# Patient Record
Sex: Male | Born: 1990 | Race: Black or African American | Hispanic: No | Marital: Single | Smoking: Current every day smoker
Health system: Southern US, Community
[De-identification: ages and names within clinical notes are randomized; demographics above are authoritative.]

---
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
## 2009-02-14 ENCOUNTER — Emergency Department (HOSPITAL_COMMUNITY): Admission: EM | Admit: 2009-02-14 | Discharge: 2009-02-14 | Payer: Self-pay | Admitting: Emergency Medicine

## 2009-10-10 ENCOUNTER — Ambulatory Visit: Payer: Self-pay | Admitting: Family Medicine

## 2009-10-10 DIAGNOSIS — F1210 Cannabis abuse, uncomplicated: Secondary | ICD-10-CM | POA: Insufficient documentation

## 2009-10-10 DIAGNOSIS — F17200 Nicotine dependence, unspecified, uncomplicated: Secondary | ICD-10-CM

## 2009-10-10 DIAGNOSIS — R350 Frequency of micturition: Secondary | ICD-10-CM

## 2009-10-11 ENCOUNTER — Encounter (INDEPENDENT_AMBULATORY_CARE_PROVIDER_SITE_OTHER): Payer: Self-pay | Admitting: *Deleted

## 2009-10-11 LAB — CONVERTED CEMR LAB
AST: 17 units/L (ref 0–37)
Albumin: 4.5 g/dL (ref 3.5–5.2)
Alkaline Phosphatase: 44 units/L (ref 39–117)
Basophils Relative: 1 % (ref 0.0–3.0)
CO2: 31 meq/L (ref 19–32)
Calcium: 10 mg/dL (ref 8.4–10.5)
Chloride: 103 meq/L (ref 96–112)
Cholesterol: 149 mg/dL (ref 0–200)
Creatinine, Ser: 0.9 mg/dL (ref 0.4–1.5)
Eosinophils Relative: 6.8 % — ABNORMAL HIGH (ref 0.0–5.0)
Glucose, Bld: 82 mg/dL (ref 70–99)
HCT: 44.8 % (ref 39.0–52.0)
MCHC: 33.6 g/dL (ref 30.0–36.0)
MCV: 104.2 fL — ABNORMAL HIGH (ref 78.0–100.0)
Monocytes Relative: 7.6 % (ref 3.0–12.0)
RBC: 4.3 M/uL (ref 4.22–5.81)
TSH: 1.03 microintl units/mL (ref 0.35–5.50)
WBC: 5.1 10*3/uL (ref 4.5–10.5)

## 2009-10-12 ENCOUNTER — Telehealth (INDEPENDENT_AMBULATORY_CARE_PROVIDER_SITE_OTHER): Payer: Self-pay | Admitting: *Deleted

## 2009-10-12 LAB — CONVERTED CEMR LAB: Chlamydia, Swab/Urine, PCR: NEGATIVE

## 2010-01-30 ENCOUNTER — Emergency Department (HOSPITAL_COMMUNITY): Admission: EM | Admit: 2010-01-30 | Discharge: 2010-01-31 | Payer: Self-pay | Admitting: Emergency Medicine

## 2010-01-31 ENCOUNTER — Ambulatory Visit: Payer: Self-pay | Admitting: Family Medicine

## 2010-01-31 DIAGNOSIS — F329 Major depressive disorder, single episode, unspecified: Secondary | ICD-10-CM

## 2010-03-02 ENCOUNTER — Ambulatory Visit: Payer: Self-pay | Admitting: Family Medicine

## 2010-11-09 NOTE — Progress Notes (Signed)
Summary: lab results   Phone Note Outgoing Call   Call placed by: [facility] CMA,  October 12, 2009 8:41 AM Summary of Call: left message to call office with pt mother.........................Marland KitchenFelecia Deloach CMA  October 12, 2009 8:41 AM   Chlamydia and GC Probe Amp, Urine , hiv: normal  Follow-up for Phone Call        pt aware...................Marland KitchenFelecia Deloach CMA  October 13, 2009 8:40 AM

## 2010-11-09 NOTE — Letter (Signed)
Summary: Results Follow up Letter  [facility] [place]  [address] [place], [place] [address]   Phone: [phone]  Fax: [phone]    10/11/2009 MRN: [id number]  [facility] [address] [place], [place]  [address]  Dear Mr. Smithers,  The following are the results of your recent test(s):  Test         Result    Pap Smear:        Normal _____  Not Normal _____ Comments: ______________________________________________________ Cholesterol: LDL(Bad cholesterol):         Your goal is less than:         HDL (Good cholesterol):       Your goal is more than: Comments:  ______________________________________________________ Mammogram:        Normal _____  Not Normal _____ Comments:  ___________________________________________________________________ Hemoccult:        Normal _____  Not normal _______ Comments:    _____________________________________________________________________ Other Tests:  See attachment for results.   We routinely do not discuss normal results over the telephone.  If you desire a copy of the results, or you have any questions about this information we can discuss them at your next office visit.   Sincerely,    Army Fossa CMA  October 11, 2009 9:37 AM

## 2010-11-09 NOTE — Assessment & Plan Note (Signed)
Summary: SEVERE DEPRESSION,F/U ER VISIT ON 01-30-2010/RH........Edgar Orr   Vital Signs:  Patient profile:   20 year old male Weight:      137 pounds Pulse rate:   90 / minute Pulse rhythm:   regular BP sitting:   122 / 80  (left arm) Cuff size:   regular  Vitals Entered By: Army Fossa CMA (January 31, 2010 3:21 PM) CC: Pt here was sent to the ER last for Alcohol posioning. Discuss help- would like help, Depressive symptoms   History of Present Illness:  Depressive symptoms      This is a 20 year old man who presents with Depressive symptoms.  The symptoms began >1 year ago.  Pt here with mom as f/u from Er last night for intoxication.  Pt states it was an accident--He was not trying to hurt himself.  He was also smoking marijuana.  The patient reports depressed mood, but denies loss of interest/pleasure, significant weight loss, significant weight gain, insomnia, hypersomnia, psychomotor agitation, and psychomotor retardation.  The patient also reports fatigue or loss of energy and diminished concentration.  The patient denies feelings of worthlessness, indecisiveness, thoughts of death, thoughts of suicide, suicidal intent, and suicidal plans.  The patient reports the following psychosocial stressors: major life changes.  Patient's past history includes alcohol/substance abuse.  The patient reports the following manic symptoms: abnormally irritable mood.  The patient denies abnormally elevated mood, decreased need for sleep, increased talkativeness, distractibility, flight of ideas, increased goal-directed activity, and inflated self-esteem/ grandiosity.    Preventive Screening-Counseling & Management  Alcohol-Tobacco     Alcohol drinks/day: <1     Alcohol type: beer     >5/day in last 3 mos: yes     Alcohol Counseling: to STOP drinking     Feels need to cut down: yes     Smoking Status: current     Smoking Cessation Counseling: yes     Smoke Cessation Stage: contemplative     Packs/Day:  0.25     Year Started: 2006  Caffeine-Diet-Exercise     Caffeine use/day: 0     Diet Comments: good per pt     Does Patient Exercise: yes     Type of exercise: run,  pushups     Exercise (avg: min/session): 30-60     Times/week: 3      Drug Use:  current and marijuana.    Current Medications (verified): 1)  Symbyax 6-25 Mg Caps (Olanzapine-Fluoxetine Hcl) .Edgar Orr.. 1 By Mouth Qpm  Allergies (verified): No Known Drug Allergies  Past History:  Past medical, surgical, family and social histories (including risk factors) reviewed for relevance to current acute and chronic problems.  Past Medical History: Unremarkable Depression-- ? bipolar  Past Surgical History: Reviewed history from 10/10/2009 and no changes required. Appendectomy Tonsillectomy  Family History: Reviewed history from 10/10/2009 and no changes required. Family History High cholesterol Family History Hypertension Muncles and cousins----DM Family History Liver disease  Social History: Reviewed history from 10/10/2009 and no changes required. 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 Lungs are clear to auscultation, no crackles or wheezes. Heart:  normal rate and no murmur.   Psych:  Oriented X3, normally interactive, depressed affect, subdued, withdrawn, poor  eye contact, and tearful.     Impression & Recommendations:  Problem # 1:  DEPRESSION (ICD-311)  symbyax 3/25----  increase to 6/ 25 2-3 weeks psych referral f/u 1 month or sooner as needed  pt here > 25 min with > 50 % face to face  Discussed treatment options, including trial of antidpressant medication.  Will refer to behavioral health. Follow-up call in in 24-48 hours and recheck in 4 weeks, sooner as needed. Patient agrees to call if any worsening of symptoms or thoughts of doing harm arise. Verified that the patient has no suicidal ideation at this time.   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 1 by mouth qpm Prescriptions: SYMBYAX 6-25 MG CAPS (OLANZAPINE-FLUOXETINE HCL) 1 by mouth qpm  #30 x 1   Entered and Authorized by:   Loreen Freud DO   Signed by:   Loreen Freud DO on 01/31/2010   Method used:   Print then Give to Patient   RxID:   [id number]

## 2010-11-09 NOTE — Assessment & Plan Note (Signed)
Summary: new to be est., would like to have physical- jr   Vital Signs:  Patient profile:   20 year old male Height:      68.25 inches Weight:      142 pounds BMI:     21.51 Temp:     98.1 degrees F oral Pulse rate:   86 / minute Pulse rhythm:   regular BP sitting:   122 / 78  (left arm) Cuff size:   regular  Vitals Entered By: Army Fossa CMA (October 10, 2009 10:06 AM) CC: To establish, would like CPX.   Vision Screening:Left eye w/o correction: 20 / 10 Right Eye w/o correction: 20 / 20 Both eyes w/o correction:  20/ 13        Vision Entered By: Army Fossa CMA (October 10, 2009 11:00 AM)   History of Present Illness: Pt here for cpe and labs.  Pt complains of R sided chest pain that is improving.  Pt thinks he aggravated it lifting furniture and containers a  few months ago.  He is not doing that anymore and it is improving and has not bothered him in a couple of weeks.   Pt also c/o urinary frequency.  no dysuria.  + constipation occassionally.   No abd pain, no heartburn.  No penile D/C.  Preventive Screening-Counseling & Management  Alcohol-Tobacco     Alcohol drinks/day: <1     Alcohol type: beer     Smoking Status: current     Smoking Cessation Counseling: yes     Smoke Cessation Stage: contemplative     Packs/Day: 0.25     Year Started: 2006  Caffeine-Diet-Exercise     Caffeine use/day: 0     Diet Comments: good per pt     Does Patient Exercise: yes     Type of exercise: run,  pushups     Exercise (avg: min/session): 30-60     Times/week: 3  Hep-HIV-STD-Contraception     Dental Visit-last 6 months yes     Dental Care Counseling: not indicated; dental care within six months     TSE monthly: yes     Testicular SE Education/Counseling to perform regular STE  Safety-Violence-Falls     Seat Belt Use: yes     Seat Belt Counseling: not indicated; patient wears seat belts      Sexual History:  currently monogamous and multiple partners in the  past.        Drug Use:  current, marijuana, and yes-- last use yesterday--smokes 1-2 a day.    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 Family History High cholesterol Family History Hypertension Muncles and cousins----DM Family History Liver disease  Social History: Reviewed history and no changes required. 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 Eyes:  Denies blurring, discharge, double vision, eye irritation, eye pain, halos, itching, light sensitivity, red eye, vision loss-1 eye, and vision loss-both eyes; optho--last 2 years . ENT:  Denies decreased hearing, difficulty swallowing, ear discharge, earache, hoarseness, nasal congestion, nosebleeds, postnasal drainage, ringing in ears, sinus pressure, and sore throat. CV:   Denies bluish discoloration of lips or nails, chest pain or discomfort, difficulty breathing at night, difficulty breathing while lying down, fainting, fatigue, leg cramps with exertion, lightheadness, near fainting, palpitations, shortness of breath with exertion, swelling of feet, swelling of hands, and weight gain. Resp:  Denies chest discomfort, chest pain with inspiration, cough, coughing up blood, excessive snoring, hypersomnolence, morning headaches, pleuritic, shortness of breath, sputum productive, and wheezing. GI:  Complains of abdominal pain and constipation; denies bloody stools, change in bowel habits, dark tarry stools, diarrhea, excessive appetite, gas, hemorrhoids, indigestion, loss of appetite, nausea, vomiting, vomiting blood, and yellowish skin color. GU:  Complains of urinary frequency; denies decreased libido, discharge, dysuria, erectile dysfunction, genital sores, hematuria, incontinence, nocturia, and urinary hesitancy. MS:  Denies joint pain, joint redness, joint swelling, loss of strength, low back pain, mid back pain, muscle aches, muscle , cramps, muscle weakness, stiffness, and thoracic pain. Derm:  Denies changes in color of skin, changes in nail beds, dryness, excessive perspiration, flushing, hair loss, insect bite(s), itching, lesion(s), poor wound healing, and rash. Neuro:  Denies brief paralysis, difficulty with concentration, disturbances in coordination, falling down, headaches, inability to speak, memory loss, numbness, poor balance, seizures, sensation of room spinning, tingling, tremors, visual disturbances, and weakness. Psych:  Denies alternate hallucination ( auditory/visual), anxiety, depression, easily angered, easily tearful, irritability, mental problems, panic attacks, sense of great danger, suicidal thoughts/plans, thoughts of violence, unusual visions or sounds, and thoughts /plans of harming others. Endo:  Complains of polyuria; denies cold intolerance,  excessive hunger, excessive thirst, excessive urination, heat intolerance, and weight change. Heme:  Denies abnormal bruising, bleeding, enlarge lymph nodes, fevers, pallor, and skin discoloration. Allergy:  Denies hives or rash, itching eyes, persistent infections, seasonal allergies, and sneezing.  Physical Exam  General:  Well-developed,well-nourished,in no acute distress; alert,appropriate and cooperative throughout examination Head:  Normocephalic and atraumatic without obvious abnormalities. No apparent alopecia or balding. Eyes:  vision grossly intact, pupils equal, pupils round, pupils reactive to light, and no injection.   Ears:  External ear exam shows no significant lesions or deformities.  Otoscopic examination reveals clear canals, tympanic membranes are intact bilaterally without bulging, retraction, inflammation or discharge. Hearing is grossly normal bilaterally. Nose:  External nasal examination shows no deformity or inflammation. Nasal mucosa are pink and moist without lesions or exudates. Mouth:  Oral mucosa and oropharynx without lesions or exudates.  Teeth in good repair. Neck:  No deformities, masses, or tenderness noted. Chest Wall:  No deformities, masses, tenderness or gynecomastia noted. Lungs:  Normal respiratory effort, chest expands symmetrically. Lungs are clear to auscultation, no crackles or wheezes. Heart:  normal rate and no murmur.   Abdomen:  Bowel sounds positive,abdomen soft and non-tender without masses, organomegaly or hernias noted. Genitalia:  Testes bilaterally descended without nodularity, tenderness or masses. No scrotal masses or lesions. No penis lesions or urethral discharge. Msk:  normal ROM, no  joint tenderness, no joint swelling, no joint warmth, no redness over joints, no joint deformities, no joint instability, and no crepitation.   Pulses:  R and L carotid,radial,femoral,dorsalis pedis and posterior tibial pulses are full and equal  bilaterally Extremities:  No clubbing, cyanosis, edema, or deformity noted with normal full range of motion of all joints.   Neurologic:  No cranial nerve deficits noted. Station and gait are normal. Plantar reflexes are down-going bilaterally. DTRs are symmetrical throughout. Sensory, motor and coordinative functions appear intact. Skin:  Intact without suspicious lesions or rashes Cervical Nodes:  No lymphadenopathy noted Axillary Nodes:  No palpable lymphadenopathy Psych:  Cognition and judgment appear intact. Alert and cooperative with normal attention span and concentration. No apparent delusions, illusions, hallucinations   Impression & Recommendations:  Problem # 1:  PREVENTIVE HEALTH CARE (ICD-V70.0) ghm utd  Orders: Venipuncture (81191) TLB-Lipid Panel (80061-LIPID) TLB-BMP (Basic Metabolic Panel-BMET) (80048-METABOL) TLB-CBC Platelet - w/Differential (85025-CBCD) TLB-Hepatic/Liver Function Pnl (80076-HEPATIC) TLB-TSH (Thyroid Stimulating Hormone) (84443-TSH) T-Chlamydia  Probe, urine (47829-56213) T-GC Probe, urine (08657-84696) T-HIV Antibody  (Reflex) ([id number]) T-RPR (Syphilis) (40102-72536) T- * Misc. Laboratory test ([phone]) Tobacco use cessation intermediate 3-10 minutes (47425) Vision Screening (95638)  Reviewed preventive care protocols, scheduled due services, and updated immunizations.  Problem # 2:  SEXUAL ACTIVITY, HIGH RISK (ICD-V69.2)  Orders: Venipuncture (75643) TLB-Lipid Panel (80061-LIPID) TLB-BMP (Basic Metabolic Panel-BMET) (80048-METABOL) TLB-CBC Platelet - w/Differential (85025-CBCD) TLB-Hepatic/Liver Function Pnl (80076-HEPATIC) TLB-TSH (Thyroid Stimulating Hormone) (84443-TSH) T-Chlamydia  Probe, urine (32951-88416) T-GC Probe, urine (60630-16010) T-HIV Antibody  (Reflex) [id number]) T-RPR (Syphilis) (02542-70623) T- * Misc. Laboratory test [phone]) Tobacco use cessation intermediate 3-10 minutes (15176)  Problem # 3:  FREQUENCY,  URINARY (ICD-788.41)  Orders: Venipuncture (16073) TLB-Lipid Panel (80061-LIPID) TLB-BMP (Basic Metabolic Panel-BMET) (80048-METABOL) TLB-CBC Platelet - w/Differential (85025-CBCD) TLB-Hepatic/Liver Function Pnl (80076-HEPATIC) TLB-TSH (Thyroid Stimulating Hormone) (84443-TSH) T-Chlamydia  Probe, urine (71062-69485) T-GC Probe, urine (46270-35009) T-HIV Antibody  (Reflex) [id number]) T-RPR (Syphilis) (69678-93810) T- * Misc. Laboratory test [id number]) Tobacco use cessation intermediate 3-10 minutes (25852)  Discussed use of medication.   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

## 2010-12-26 LAB — COMPREHENSIVE METABOLIC PANEL
ALT: 23 U/L (ref 0–53)
AST: 22 U/L (ref 0–37)
Alkaline Phosphatase: 42 U/L (ref 39–117)
Calcium: 9.2 mg/dL (ref 8.4–10.5)
GFR calc Af Amer: >60 mL/min (ref >60)
GFR calc non Af Amer: >60 mL/min (ref >60)
Total Protein: 7.6 g/dL (ref 6.0–8.3)

## 2010-12-26 LAB — CBC
HCT: 41.3 % (ref 39.0–52.0)
Platelets: 216 10*3/uL (ref 150–400)
RBC: 4.07 MIL/uL — ABNORMAL LOW (ref 4.22–5.81)
RDW: 13.4 % (ref 11.5–15.5)
WBC: 5.2 10*3/uL (ref 4.0–10.5)

## 2010-12-26 LAB — URINALYSIS, ROUTINE W REFLEX MICROSCOPIC
Bilirubin Urine: NEGATIVE
Hgb urine dipstick: NEGATIVE
Ketones, ur: NEGATIVE mg/dL
Nitrite: NEGATIVE
Specific Gravity, Urine: 1.015 (ref 1.005–1.030)

## 2010-12-26 LAB — RAPID URINE DRUG SCREEN, HOSP PERFORMED
Amphetamines: NOT DETECTED
Benzodiazepines: NOT DETECTED

## 2010-12-26 LAB — ACETAMINOPHEN LEVEL: Acetaminophen (Tylenol), Serum: <10 ug/mL — ABNORMAL LOW (ref 10–30)

## 2010-12-26 LAB — DIFFERENTIAL
Basophils Absolute: 0 10*3/uL (ref 0.0–0.1)
Basophils Relative: 1 % (ref 0–1)
Neutro Abs: 3.3 10*3/uL (ref 1.7–7.7)
Neutrophils Relative %: 64 % (ref 43–77)

## 2011-02-23 NOTE — Op Note (Signed)
NAME:  Edgar Orr, Edgar Orr                        ACCOUNT NO.:  [id number]   MEDICAL RECORD NO.:  [id number]                   PATIENT TYPE:  AMB   LOCATION:  [facility]                                  FACILITY:  [facility]   PHYSICIAN:  Jefry H. Pollyann Kennedy, M.D.                DATE OF BIRTH:  Sep 22, 1991   DATE OF PROCEDURE:  05/15/2004  DATE OF DISCHARGE:                                 OPERATIVE REPORT   PREOPERATIVE DIAGNOSIS:  Chronic tonsillitis.   POSTOPERATIVE DIAGNOSIS:  Chronic tonsillitis.   PROCEDURE:  Tonsillectomy.   SURGEON:  Jefry H. Pollyann Kennedy, M.D.   ANESTHESIA:  General endotracheal anesthesia.   COMPLICATIONS:  None.   ESTIMATED BLOOD LOSS:  None.   FINDINGS:  Tonsils enlarged with deep cryptic spaces and multiple areas with  cryptic debris.   REFERRING PHYSICIAN:  Tinnie Gens A. Tawanna Cooler, M.D. 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  The risks,  benefits, alternatives, and complications of the procedure were explained to  the parents who seemed to understand and agreed to the surgery.   PROCEDURE:  The patient was taken to the operating room and placed on the  operating table in supine position.  Following induction of general  endotracheal anesthesia, the table was turned 90 degrees and the patient was  draped in standard fashion.  A Crowe-Davis mouth gag was inserted through  the oral cavity and used to retract the tongue and mandible and attached to  the Mayo stand.  Inspection of the palate revealed no evidence of a  submucous cleft or shortening of the soft palate.  Red rubber catheter was  inserted through the right side of the nose and withdrawn through the mouth  and used to retract the soft palate and uvula.  Indirect examination of the  pharynx was performed and no visible adenoid tissue was present.  The  tonsillectomy was performed using electrocautery dissection carefully  dissecting the avascular plane between the capsule and the constricture  muscles.  The tonsils were sent together for pathologic evaluation.  There  was minimal bleeding.  Spot cautery was used as needed.  The pharynx was  suctioned of secretions, irrigated with saline solution, an orogastric tube  was used to aspirate the contents of the stomach.  The patient was then  awakened, extubated, and transferred to the recovery room in stable  condition.                                              Jefry H. Pollyann Kennedy, M.D.   JHR/MEDQ  D:  05/15/2004  T:  05/15/2004  Job:  [id number]   cc:   Tinnie Gens A. Tawanna Cooler, M.D. [facility]

## 2012-02-25 ENCOUNTER — Emergency Department (HOSPITAL_COMMUNITY)
Admission: EM | Admit: 2012-02-25 | Discharge: 2012-02-25 | Disposition: A | Discharge status: Home / Self Care | Payer: Self-pay | Attending: Emergency Medicine | Admitting: Emergency Medicine

## 2012-02-25 ENCOUNTER — Encounter (HOSPITAL_COMMUNITY): Payer: Self-pay | Admitting: Emergency Medicine

## 2012-02-25 DIAGNOSIS — Y9289 Other specified places as the place of occurrence of the external cause: Secondary | ICD-10-CM | POA: Insufficient documentation

## 2012-02-25 DIAGNOSIS — S0180XA Unspecified open wound of other part of head, initial encounter: Secondary | ICD-10-CM | POA: Insufficient documentation

## 2012-02-25 DIAGNOSIS — IMO0002 Reserved for concepts with insufficient information to code with codable children: Secondary | ICD-10-CM

## 2012-02-25 MED ORDER — BACITRACIN ZINC 500 UNIT/GM EX OINT
1.0000 "application " | TOPICAL_OINTMENT | Freq: Once | CUTANEOUS | Status: AC
  Administered 2012-02-25: 1 via TOPICAL

## 2012-02-25 MED ORDER — TETANUS-DIPHTH-ACELL PERTUSSIS 5-2.5-18.5 LF-MCG/0.5 IM SUSP
0.5000 mL | Freq: Once | INTRAMUSCULAR | Status: AC
  Administered 2012-02-25: 0.5 mL via INTRAMUSCULAR
  Filled 2012-02-25: qty 0.5

## 2012-02-25 MED ORDER — BACITRACIN ZINC 500 UNIT/GM EX OINT
TOPICAL_OINTMENT | CUTANEOUS | Status: AC
  Administered 2012-02-25: 1 via TOPICAL
  Filled 2012-02-25: qty 1.8

## 2012-02-25 NOTE — Discharge Instructions (Signed)
Laceration Care, Adult A laceration is a cut or lesion that goes through all layers of the skin and into the tissue just beneath the skin. TREATMENT  Some lacerations may not require closure. Some lacerations may not be able to be closed due to an increased risk of infection. It is important to see your caregiver as soon as possible after an injury to minimize the risk of infection and maximize the opportunity for successful closure. If closure is appropriate, pain medicines may be given, if needed. The wound will be cleaned to help prevent infection. Your caregiver will use stitches (sutures), staples, wound glue (adhesive), or skin adhesive strips to repair the laceration. These tools bring the skin edges together to allow for faster healing and a better cosmetic outcome. However, all wounds will heal with a scar. Once the wound has healed, scarring can be minimized by covering the wound with sunscreen during the day for 1 full year. HOME CARE INSTRUCTIONS  For sutures or staples:  Keep the wound clean and dry.   If you were given a bandage (dressing), you should change it at least once a day. Also, change the dressing if it becomes wet or dirty, or as directed by your caregiver.   Wash the wound with soap and water 2 times a day. Rinse the wound off with water to remove all soap. Pat the wound dry with a clean towel.   After cleaning, apply a thin layer of the antibiotic ointment as recommended by your caregiver. This will help prevent infection and keep the dressing from sticking.   You may shower as usual after the first 24 hours. Do not soak the wound in water until the sutures are removed.   Only take over-the-counter or prescription medicines for pain, discomfort, or fever as directed by your caregiver.   Get your sutures or staples removed as directed by your caregiver in 5 days You may need a tetanus shot if:  You cannot remember when you had your last tetanus shot.   You have  never had a tetanus shot.  If you get a tetanus shot, your arm may swell, get red, and feel warm to the touch. This is common and not a problem. If you need a tetanus shot and you choose not to have one, there is a rare chance of getting tetanus. Sickness from tetanus can be serious. SEEK MEDICAL CARE IF:   You have redness, swelling, or increasing pain in the wound.   You see a red line that goes away from the wound.   You have yellowish-white fluid (pus) coming from the wound.   You have a fever.   You notice a bad smell coming from the wound or dressing.   Your wound breaks open before or after sutures have been removed.   You notice something coming out of the wound such as wood or glass.   Your wound is on your hand or foot and you cannot move a finger or toe.  SEEK IMMEDIATE MEDICAL CARE IF:   Your pain is not controlled with prescribed medicine.   You have severe swelling around the wound causing pain and numbness or a change in color in your arm, hand, leg, or foot.   Your wound splits open and starts bleeding.   You have worsening numbness, weakness, or loss of function of any joint around or beyond the wound.   You develop painful lumps near the wound or on the skin anywhere on your body.  MAKE SURE YOU:   Understand these instructions.   Will watch your condition.   Will get help right away if you are not doing well or get worse.
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
## 2012-02-25 NOTE — ED Notes (Signed)
Pt has 1cm laceration to Left eye, dsd applied, bleeding controlled
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
## 2013-01-30 ENCOUNTER — Emergency Department (HOSPITAL_COMMUNITY)
Admission: EM | Admit: 2013-01-30 | Discharge: 2013-01-30 | Disposition: A | Discharge status: Home / Self Care | Payer: Self-pay | Attending: Emergency Medicine | Admitting: Emergency Medicine

## 2013-01-30 ENCOUNTER — Encounter (HOSPITAL_COMMUNITY): Payer: Self-pay | Admitting: Family Medicine

## 2013-01-30 DIAGNOSIS — Y9389 Activity, other specified: Secondary | ICD-10-CM | POA: Insufficient documentation

## 2013-01-30 DIAGNOSIS — S4980XA Other specified injuries of shoulder and upper arm, unspecified arm, initial encounter: Secondary | ICD-10-CM | POA: Insufficient documentation

## 2013-01-30 DIAGNOSIS — W268XXA Contact with other sharp object(s), not elsewhere classified, initial encounter: Secondary | ICD-10-CM | POA: Insufficient documentation

## 2013-01-30 DIAGNOSIS — S46909A Unspecified injury of unspecified muscle, fascia and tendon at shoulder and upper arm level, unspecified arm, initial encounter: Secondary | ICD-10-CM | POA: Insufficient documentation

## 2013-01-30 DIAGNOSIS — S51809A Unspecified open wound of unspecified forearm, initial encounter: Secondary | ICD-10-CM | POA: Insufficient documentation

## 2013-01-30 DIAGNOSIS — F17200 Nicotine dependence, unspecified, uncomplicated: Secondary | ICD-10-CM | POA: Insufficient documentation

## 2013-01-30 DIAGNOSIS — S51832A Puncture wound without foreign body of left forearm, initial encounter: Secondary | ICD-10-CM

## 2013-01-30 DIAGNOSIS — Y929 Unspecified place or not applicable: Secondary | ICD-10-CM | POA: Insufficient documentation

## 2013-01-30 MED ORDER — TETANUS-DIPHTH-ACELL PERTUSSIS 5-2.5-18.5 LF-MCG/0.5 IM SUSP
0.5000 mL | Freq: Once | INTRAMUSCULAR | Status: AC
  Administered 2013-01-30: 0.5 mL via INTRAMUSCULAR
  Filled 2013-01-30: qty 0.5

## 2013-01-30 MED ORDER — TRAMADOL HCL 50 MG PO TABS: 50.0000 mg | ORAL_TABLET | Freq: Four times a day (QID) | ORAL | Status: DC | PRN
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
## 2013-01-30 NOTE — ED Provider Notes (Signed)
Medical screening examination/treatment/procedure(s) were performed by non-physician practitioner and as supervising physician I was immediately available for consultation/collaboration.    Anav Lammert D Gokul Waybright, MD 01/30/13 2134 

## 2016-06-08 ENCOUNTER — Ambulatory Visit (INDEPENDENT_AMBULATORY_CARE_PROVIDER_SITE_OTHER): Payer: Self-pay | Admitting: Adult Health

## 2016-06-08 ENCOUNTER — Encounter: Payer: Self-pay | Admitting: Adult Health

## 2016-06-08 VITALS — BP 112/64 | Temp 98.1°F | Ht 68.25 in | Wt 146.6 lb

## 2016-06-08 DIAGNOSIS — Z0000 Encounter for general adult medical examination without abnormal findings: Secondary | ICD-10-CM

## 2016-06-08 DIAGNOSIS — F17200 Nicotine dependence, unspecified, uncomplicated: Secondary | ICD-10-CM

## 2016-06-08 LAB — LIPID PANEL
CHOL/HDL RATIO: 3
Cholesterol: 174 mg/dL (ref 0–200)
HDL: 66.7 mg/dL (ref >39.00)
LDL Cholesterol: 97 mg/dL (ref 0–99)
NONHDL: 107.74
Triglycerides: 53 mg/dL (ref 0.0–149.0)
VLDL: 10.6 mg/dL (ref 0.0–40.0)

## 2016-06-08 LAB — BASIC METABOLIC PANEL
BUN: 8 mg/dL (ref 6–23)
CALCIUM: 9.9 mg/dL (ref 8.4–10.5)
CO2: 31 mEq/L (ref 19–32)
Chloride: 102 mEq/L (ref 96–112)
Creatinine, Ser: 0.99 mg/dL (ref 0.40–1.50)
GFR: 117.66 mL/min (ref >60.00)
Glucose, Bld: 80 mg/dL (ref 70–99)
Potassium: 3.8 mEq/L (ref 3.5–5.1)
SODIUM: 139 meq/L (ref 135–145)

## 2016-06-08 LAB — CBC WITH DIFFERENTIAL/PLATELET
BASOS ABS: 0.1 10*3/uL (ref 0.0–0.1)
BASOS PCT: 1.2 % (ref 0.0–3.0)
Eosinophils Absolute: 0.3 10*3/uL (ref 0.0–0.7)
Eosinophils Relative: 7 % — ABNORMAL HIGH (ref 0.0–5.0)
HCT: 43.4 % (ref 39.0–52.0)
Hemoglobin: 15.2 g/dL (ref 13.0–17.0)
LYMPHS ABS: 1.8 10*3/uL (ref 0.7–4.0)
Lymphocytes Relative: 36.8 % (ref 12.0–46.0)
MCHC: 35 g/dL (ref 30.0–36.0)
MCV: 100.4 fl — AB (ref 78.0–100.0)
MONOS PCT: 9.8 % (ref 3.0–12.0)
Monocytes Absolute: 0.5 10*3/uL (ref 0.1–1.0)
NEUTROS ABS: 2.2 10*3/uL (ref 1.4–7.7)
NEUTROS PCT: 45.2 % (ref 43.0–77.0)
PLATELETS: 276 10*3/uL (ref 150.0–400.0)
RBC: 4.33 Mil/uL (ref 4.22–5.81)
RDW: 15.3 % (ref 11.5–15.5)
WBC: 4.8 10*3/uL (ref 4.0–10.5)

## 2016-06-08 LAB — HEPATIC FUNCTION PANEL
ALBUMIN: 4.8 g/dL (ref 3.5–5.2)
ALK PHOS: 44 U/L (ref 39–117)
ALT: 12 U/L (ref 0–53)
AST: 17 U/L (ref 0–37)
BILIRUBIN DIRECT: 0.2 mg/dL (ref 0.0–0.3)
Total Bilirubin: 0.9 mg/dL (ref 0.2–1.2)
Total Protein: 8 g/dL (ref 6.0–8.3)

## 2016-06-08 LAB — TSH: TSH: 0.69 u[IU]/mL (ref 0.35–4.50)

## 2016-06-08 NOTE — Patient Instructions (Addendum)
It was great meeting you today!   I will follow up with you regarding your labs.   Pick up a probiotic and see if that helps with your bowel issues. Please let me know if you are not feeling any better in a couple of weeks.   Quit smoking!  Health Maintenance, Male A healthy lifestyle and preventative care can promote health and wellness.  Maintain regular health, dental, and eye exams.  Eat a healthy diet. Foods like vegetables, fruits, whole grains, low-fat dairy products, and lean protein foods contain the nutrients you need and are low in calories. Decrease your intake of foods high in solid fats, added sugars, and salt. Get information about a proper diet from your health care provider, if necessary.  Regular physical exercise is one of the most important things you can do for your health. Most adults should get at least 150 minutes of moderate-intensity exercise (any activity that increases your heart rate and causes you to sweat) each week. In addition, most adults need muscle-strengthening exercises on 2 or more days a week.   Maintain a healthy weight. The body mass index (BMI) is a screening tool to identify possible weight problems. It provides an estimate of body fat based on height and weight. Your health care provider can find your BMI and can help you achieve or maintain a healthy weight. For males 20 years and older:  A BMI below 18.5 is considered underweight.  A BMI of 18.5 to 24.9 is normal.  A BMI of 25 to 29.9 is considered overweight.  A BMI of 30 and above is considered obese.  Maintain normal blood lipids and cholesterol by exercising and minimizing your intake of saturated fat. Eat a balanced diet with plenty of fruits and vegetables. Blood tests for lipids and cholesterol should begin at age 720 and be repeated every 5 years. If your lipid or cholesterol levels are high, you are over age 26, or you are at high risk for heart disease, you may need your cholesterol  levels checked more frequently.Ongoing high lipid and cholesterol levels should be treated with medicines if diet and exercise are not working.  If you smoke, find out from your health care provider how to quit. If you do not use tobacco, do not start.  Lung cancer screening is recommended for adults aged 55-80 years who are at high risk for developing lung cancer because of a history of smoking. A yearly low-dose CT scan of the lungs is recommended for people who have at least a 30-pack-year history of smoking and are current smokers or have quit within the past 15 years. A pack year of smoking is smoking an average of 1 pack of cigarettes a day for 1 year (for example, a 30-pack-year history of smoking could mean smoking 1 pack a day for 30 years or 2 packs a day for 15 years). Yearly screening should continue until the smoker has stopped smoking for at least 15 years. Yearly screening should be stopped for people who develop a health problem that would prevent them from having lung cancer treatment.  If you choose to drink alcohol, do not have more than 2 drinks per day. One drink is considered to be 12 oz (360 mL) of beer, 5 oz (150 mL) of wine, or 1.5 oz (45 mL) of liquor.  Avoid the use of street drugs. Do not share needles with anyone. Ask for help if you need support or instructions about stopping the use of  drugs.  High blood pressure causes heart disease and increases the risk of stroke. High blood pressure is more likely to develop in:  People who have blood pressure in the end of the normal range (100-139/85-89 mm Hg).  People who are overweight or obese.  People who are African American.  If you are 29-20 years of age, have your blood pressure checked every 3-5 years. If you are 80 years of age or older, have your blood pressure checked every year. You should have your blood pressure measured twice--once when you are at a hospital or clinic, and once when you are not at a hospital or  clinic. Record the average of the two measurements. To check your blood pressure when you are not at a hospital or clinic, you can use:  An automated blood pressure machine at a pharmacy.  A home blood pressure monitor.  If you are 29-4 years old, ask your health care provider if you should take aspirin to prevent heart disease.  Diabetes screening involves taking a blood sample to check your fasting blood sugar level. This should be done once every 3 years after age 44 if you are at a normal weight and without risk factors for diabetes. Testing should be considered at a younger age or be carried out more frequently if you are overweight and have at least 1 risk factor for diabetes.  Colorectal cancer can be detected and often prevented. Most routine colorectal cancer screening begins at the age of 60 and continues through age 87. However, your health care provider may recommend screening at an earlier age if you have risk factors for colon cancer. On a yearly basis, your health care provider may provide home test kits to check for hidden blood in the stool. A small camera at the end of a tube may be used to directly examine the colon (sigmoidoscopy or colonoscopy) to detect the earliest forms of colorectal cancer. Talk to your health care provider about this at age 4 when routine screening begins. A direct exam of the colon should be repeated every 5-10 years through age 21, unless early forms of precancerous polyps or small growths are found.  People who are at an increased risk for hepatitis B should be screened for this virus. You are considered at high risk for hepatitis B if:  You were born in a country where hepatitis B occurs often. Talk with your health care provider about which countries are considered high risk.  Your parents were born in a high-risk country and you have not received a shot to protect against hepatitis B (hepatitis B vaccine).  You have HIV or AIDS.  You use needles  to inject street drugs.  You live with, or have sex with, someone who has hepatitis B.  You are a man who has sex with other men (MSM).  You get hemodialysis treatment.  You take certain medicines for conditions like cancer, organ transplantation, and autoimmune conditions.  Hepatitis C blood testing is recommended for all people born from [birth year] through 1966 and any individual with known risk factors for hepatitis C.  Healthy men should no longer receive prostate-specific antigen (PSA) blood tests as part of routine cancer screening. Talk to your health care provider about prostate cancer screening.  Testicular cancer screening is not recommended for adolescents or adult males who have no symptoms. Screening includes self-exam, a health care provider exam, and other screening tests. Consult with your health care provider about any symptoms you have  or any concerns you have about testicular cancer.  Practice safe sex. Use condoms and avoid high-risk sexual practices to reduce the spread of sexually transmitted infections (STIs).  You should be screened for STIs, including gonorrhea and chlamydia if:  You are sexually active and are younger than 24 years.  You are older than 24 years, and your health care provider tells you that you are at risk for this type of infection.  Your sexual activity has changed since you were last screened, and you are at an increased risk for chlamydia or gonorrhea. Ask your health care provider if you are at risk.  If you are at risk of being infected with HIV, it is recommended that you take a prescription medicine daily to prevent HIV infection. This is called pre-exposure prophylaxis (PrEP). You are considered at risk if:  You are a man who has sex with other men (MSM).  You are a heterosexual man who is sexually active with multiple partners.  You take drugs by injection.  You are sexually active with a partner who has HIV.  Talk with your health  care provider about whether you are at high risk of being infected with HIV. If you choose to begin PrEP, you should first be tested for HIV. You should then be tested every 3 months for as long as you are taking PrEP.  Use sunscreen. Apply sunscreen liberally and repeatedly throughout the day. You should seek shade when your shadow is shorter than you. Protect yourself by wearing long sleeves, pants, a wide-brimmed hat, and sunglasses year round whenever you are outdoors.  Tell your health care provider of new moles or changes in moles, especially if there is a change in shape or color. Also, tell your health care provider if a mole is larger than the size of a pencil eraser.  A one-time screening for abdominal aortic aneurysm (AAA) and surgical repair of large AAAs by ultrasound is recommended for men aged 44-75 years who are current or former smokers.  Stay current with your vaccines (immunizations).   This information is not intended to replace advice given to you by your health care provider. Make sure you discuss any questions you have with your health care provider.   Document Released: 03/22/2008 Document Revised: 10/15/2014 Document Reviewed: 02/19/2011 Elsevier Interactive Patient Education [employer].

## 2016-06-08 NOTE — Progress Notes (Signed)
Patient presents to clinic today to establish care. He is a pleasant 25 year old male who  has no past medical history on file.   He has not had a complete physical in " 4-5 years"   Acute Concerns: Complete Physical   Chronic Issues: Tobacco Use - He smokes anywhere between 1 - 10 cigarettes per day. He has quit cold [place] in the past and knows that he needs to quit for good.   Health Maintenance: Dental -- Routine  Vision -- Does not see routine  Immunizations --UTD  Diet: Tries to eat healthy  Exercise: he walks and does push ups.   No past medical history on file.  Past Surgical History:  Procedure Laterality Date  . TONSILLECTOMY AND ADENOIDECTOMY  1993    No current outpatient prescriptions on file prior to visit.   No current facility-administered medications on file prior to visit.     Allergies  Allergen Reactions  . Fexofenadine Hives and Swelling    Family History  Problem Relation Age of Onset  . Hypertension Mother   . Breast cancer Mother   . Hypertension Father   . Hypertension Maternal Grandmother   . Hypertension Maternal Grandfather   . Hypertension Paternal Grandmother   . Hypertension Paternal Grandfather     Social History   Social History  . Marital status: Single    Spouse name: N/A  . Number of children: N/A  . Years of education: N/A   Occupational History  . Not on file.   Social History Main Topics  . Smoking status: Current Every Day Smoker    Types: Cigarettes  . Smokeless tobacco: Not on file  . Alcohol use Yes  . Drug use:     Types: Marijuana  . Sexual activity: Not on file   Other Topics Concern  . Not on file   Social History Narrative  . No narrative on file    Review of Systems  Constitutional: Negative.   HENT: Negative.   Eyes: Negative.   Respiratory: Negative.   Cardiovascular: Negative.   Gastrointestinal: Negative.   Genitourinary: Negative.   Skin: Negative.   Neurological: Negative.     Endo/Heme/Allergies: Negative.   Psychiatric/Behavioral: Negative.   All other systems reviewed and are negative.   BP 112/64   Temp 98.1 F (36.7 C) (Oral)   Ht 5' 8.25" (1.734 m)   Wt 146 lb 9.6 oz (66.5 kg)   BMI 22.13 kg/m   Physical Exam  Constitutional: He is oriented to person, place, and time and well-developed, well-nourished, and in no distress. No distress.  HENT:  Head: Normocephalic and atraumatic.  Right Ear: External ear normal.  Left Ear: External ear normal.  Nose: Nose normal.  Mouth/Throat: Oropharynx is clear and moist. No oropharyngeal exudate.  Eyes: Conjunctivae and EOM are normal. Pupils are equal, round, and reactive to light. Right eye exhibits no discharge. Left eye exhibits no discharge. No scleral icterus.  Neck: Normal range of motion. Neck supple. No JVD present. No tracheal deviation present. No thyromegaly present.  Cardiovascular: Normal rate, regular rhythm, normal heart sounds and intact distal pulses.  Exam reveals no gallop and no friction rub.   No murmur heard. Pulmonary/Chest: Effort normal and breath sounds normal. No stridor. No respiratory distress. He has no wheezes. He has no rales. He exhibits no tenderness.  Abdominal: Soft. Bowel sounds are normal. He exhibits no distension and no mass. There is no tenderness. There is no rebound  and no guarding.  Genitourinary:  Genitourinary Comments: Deferred  Musculoskeletal: Normal range of motion. He exhibits no edema or tenderness.  Neurological: He is alert and oriented to person, place, and time. He has normal reflexes. He displays normal reflexes. No cranial nerve deficit. He exhibits normal muscle tone. Gait normal. Coordination normal. GCS score is 15.  Skin: Skin is warm and dry. No rash noted. He is not diaphoretic. No erythema. No pallor.  Psychiatric: Mood, memory, affect and judgment normal.  Nursing note and vitals reviewed.  Assessment/Plan: 1. Routine general medical  examination at a health care facility  - Benign exam - POCT Urinalysis Dipstick (Automated) - Basic metabolic panel - Hepatic function panel - Lipid panel - TSH - CBC with Differential/Platelet - Follow up in one year or sooner if needed   2. TOBACCO ABUSE - Educated on the importance of quitting smoking. We reviewed options to help him quit smoking. He would like to try and do it on his own first.  - Spent about 5 minutes of the visit talking about smoking cessation.   Shirline Frees, NP

## 2016-09-17 ENCOUNTER — Encounter (HOSPITAL_COMMUNITY): Payer: Self-pay | Admitting: Emergency Medicine

## 2016-09-17 ENCOUNTER — Other Ambulatory Visit: Payer: Self-pay

## 2016-09-17 ENCOUNTER — Emergency Department (HOSPITAL_COMMUNITY)
Admission: EM | Admit: 2016-09-17 | Discharge: 2016-09-17 | Discharge status: Against Medical Advice | Payer: Self-pay | Attending: Emergency Medicine | Admitting: Emergency Medicine

## 2016-09-17 DIAGNOSIS — R569 Unspecified convulsions: Secondary | ICD-10-CM

## 2016-09-17 DIAGNOSIS — F17210 Nicotine dependence, cigarettes, uncomplicated: Secondary | ICD-10-CM | POA: Insufficient documentation

## 2016-09-17 LAB — CBC
HCT: 41.5 % (ref 39.0–52.0)
Hemoglobin: 14.8 g/dL (ref 13.0–17.0)
MCH: 34.4 pg — ABNORMAL HIGH (ref 26.0–34.0)
MCHC: 35.7 g/dL (ref 30.0–36.0)
MCV: 96.5 fL (ref 78.0–100.0)
Platelets: 283 10*3/uL (ref 150–400)
RBC: 4.3 MIL/uL (ref 4.22–5.81)
RDW: 12.9 % (ref 11.5–15.5)
WBC: 8.7 10*3/uL (ref 4.0–10.5)

## 2016-09-17 LAB — BASIC METABOLIC PANEL
Anion gap: 13 (ref 5–15)
BUN: 8 mg/dL (ref 6–20)
CALCIUM: 10 mg/dL (ref 8.9–10.3)
CO2: 24 mmol/L (ref 22–32)
CREATININE: 1.05 mg/dL (ref 0.61–1.24)
Chloride: 98 mmol/L — ABNORMAL LOW (ref 101–111)
GFR calc Af Amer: >60 mL/min (ref >60)
GLUCOSE: 105 mg/dL — AB (ref 65–99)
Potassium: 3.9 mmol/L (ref 3.5–5.1)
Sodium: 135 mmol/L (ref 135–145)

## 2016-09-17 LAB — CBG MONITORING, ED: Glucose-Capillary: 103 mg/dL — ABNORMAL HIGH (ref 65–99)

## 2016-09-17 NOTE — ED Notes (Signed)
Pt began to wander down hallway towards front door.  This RN ran after patient asking him where he was going.  Pt had agreed with Dr. Rhunette CroftNanavati to stay until test results have returned.  Pt states he was following his dad to lobby to get his phone.  This RN ran after father who returned with phone.  Pt asked to come back to his bed in hall.  Pt wandered very slowly.  Pt then tried to step in to Trauma room, and was redirected.  Pt pacing in hallway.  This RN asked patient to get back in bed.  Explained rationale and concerns for safety.  Pt sts "I'll just stand right here".  Pt standing right beside his bed, refusing to sit down.  Pt still appears confused.  Father at bedside, patient with RN view.

## 2016-09-17 NOTE — ED Notes (Signed)
MD Nanavati at bedside at this time

## 2016-09-17 NOTE — ED Notes (Addendum)
Pt wandering in hallway past adjacent nurses station.  Asked patient where he was headed.  Patient stated he was ready to go.  This RN reminded him that he had agreed to wait for results and assessment with MD.  Patient asked to have IV removed.  This RN encouraged him to keep it in until his assessment was done.  Pt stated "please take it out or I'm going to walk out these doors with it in".  Rocky LinkKen, EMT removed  IV from pt and this RN asked patient to sign AMA form.  Pt refused and patient escorted to lobby.
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
## 2016-09-17 NOTE — ED Notes (Signed)
Pt's CBG result was 103. Informed Katie - RN and ChiropractorCrystal - RN.

## 2016-09-17 NOTE — ED Notes (Signed)
Pt pacing in hallway.  RN encouraged pt to stay in bed, but refusing.  Pt continues to ask to have IV removed.  This RN asking patient for permission to keep it in until MD sees patient.  MD made aware, reviewing chart and will come to see pt.

## 2016-09-18 NOTE — ED Provider Notes (Signed)
[facility]-EMERGENCY DEPT Provider Note   CSN: [id number] Arrival date & time: 09/17/16  1757     History   Chief Complaint Chief Complaint  Patient presents with  . Seizures    HPI Edgar Orr is a 25 y.o. male.  HPI  History reviewed. No pertinent past medical history.   Pt comes in with cc of seizure. Pt is not a very good historian, father at bedside. Pt wants to go home, and not providing any history. He reports to me that he has never had a seizure before.  Per nursing report:  "Pt had witnessed convulsive activity aprrox 2 minutes.  When EMS arrived patient was combative and disoriented.  During transport he became more alert.  Mom states no hx of seizures.  Pt states 1 seizures earlier this year but no daily meds.  Pt does have a hx of alcohol use with admitting to 1 beer today.  Pt also endorses marijuana use "occasionally".  Pt fell on concrete during seizure, has a small laceration to right cheek with no hemorrhage.  Pt alert and oriented at this time, but wanting to leave.  Pupils PERRL.  CBG 117"  Pt wants to leave, but has agreed to get basic labs. He is aware that CT head ordered, but he may not stay long if it the CT is not done quickly.   Patient Active Problem List   Diagnosis Date Noted  . DEPRESSION 01/31/2010  . TOBACCO ABUSE 10/10/2009  . CANNABIS ABUSE 10/10/2009  . FREQUENCY, URINARY 10/10/2009    Past Surgical History:  Procedure Laterality Date  . TONSILLECTOMY AND ADENOIDECTOMY  1993       Home Medications    Prior to Admission medications   Not on File    Family History Family History  Problem Relation Age of Onset  . Hypertension Mother   . Breast cancer Mother   . Hypertension Father   . Hypertension Maternal Grandmother   . Hypertension Maternal Grandfather   . Hypertension Paternal Grandmother   . Hypertension Paternal Grandfather     Social History Social History  Substance Use Topics  . Smoking status: Current Every  Day Smoker    Types: Cigarettes  . Smokeless tobacco: Never Used  . Alcohol use 1.8 oz/week    3 Cans of beer per week     Comment: 3 beers per day. Some liquor throughout the week.      Allergies   Fexofenadine   Review of Systems Review of Systems  Constitutional: Negative for activity change and appetite change.  Respiratory: Negative for cough and shortness of breath.   Cardiovascular: Negative for chest pain.  Gastrointestinal: Negative for abdominal pain.  Genitourinary: Negative for dysuria.  Neurological: Positive for seizures.     Physical Exam Updated Vital Signs BP 145/88 (BP Location: Right Arm)   Pulse (!) 59   Temp 97.8 F (36.6 C) (Oral)   Resp 16   SpO2 99%   Physical Exam  Constitutional: He is oriented to person, place, and time. He appears well-developed.  HENT:  Head: Atraumatic.  Eyes: EOM are normal. Pupils are equal, round, and reactive to light.  Neck: Neck supple.  Cardiovascular: Normal rate.   Pulmonary/Chest: Effort normal.  Neurological: He is alert and oriented to person, place, and time. No cranial nerve deficit. Coordination normal.  Skin: Skin is warm.  Shallow laceration to the R cheek  Nursing note and vitals reviewed.    ED Treatments / Results  Labs (all labs ordered are listed, but only abnormal results are displayed) Labs Reviewed  BASIC METABOLIC PANEL - Abnormal; Notable for the following:       Result Value   Chloride 98 (*)    Glucose, Bld 105 (*)    All other components within normal limits  CBC - Abnormal; Notable for the following:    MCH 34.4 (*)    All other components within normal limits  CBG MONITORING, ED - Abnormal; Notable for the following:    Glucose-Capillary 103 (*)    All other components within normal limits    EKG  EKG Interpretation None       Radiology No results found.  Procedures Procedures (including critical care time)  Medications Ordered in ED Medications - No data to  display   Initial Impression / Assessment and Plan / ED Course  I have reviewed the triage vital signs and the nursing notes.  Pertinent labs & imaging results that were available during my care of the patient were reviewed by me and considered in my medical decision making (see chart for details).  Clinical Course     DDx: -Seizure disorder -Meningitis -Trauma -ICH -Electrolyte abnormality -Metabolic derangement -Stroke -Toxin induced seizures -Medication side effects  We will get basic labs. Nursing report mentions that pt had a seizure once before -but pt denying it to me. Father not aware of previous CT. Pt uses marijuana. Denies any headaches right now or pain from the seizure. Pt might leave AMA.  Final Clinical Impressions(s) / ED Diagnoses   Final diagnoses:  Seizure (HCC)    New Prescriptions There are no discharge medications for this patient.    Derwood KaplanAnkit Oksana Deberry, MD 09/18/16 [phone]

## 2017-12-17 ENCOUNTER — Other Ambulatory Visit: Payer: Self-pay

## 2017-12-17 ENCOUNTER — Emergency Department (HOSPITAL_COMMUNITY)
Admission: EM | Admit: 2017-12-17 | Discharge: 2017-12-17 | Disposition: A | Discharge status: Home / Self Care | Payer: Self-pay | Attending: Emergency Medicine | Admitting: Emergency Medicine

## 2017-12-17 ENCOUNTER — Emergency Department (HOSPITAL_COMMUNITY): Payer: Self-pay

## 2017-12-17 ENCOUNTER — Encounter (HOSPITAL_COMMUNITY): Payer: Self-pay | Admitting: *Deleted

## 2017-12-17 DIAGNOSIS — S0081XA Abrasion of other part of head, initial encounter: Secondary | ICD-10-CM | POA: Insufficient documentation

## 2017-12-17 DIAGNOSIS — Z046 Encounter for general psychiatric examination, requested by authority: Secondary | ICD-10-CM

## 2017-12-17 DIAGNOSIS — Y939 Activity, unspecified: Secondary | ICD-10-CM | POA: Insufficient documentation

## 2017-12-17 DIAGNOSIS — S0083XA Contusion of other part of head, initial encounter: Secondary | ICD-10-CM | POA: Insufficient documentation

## 2017-12-17 DIAGNOSIS — F322 Major depressive disorder, single episode, severe without psychotic features: Secondary | ICD-10-CM | POA: Insufficient documentation

## 2017-12-17 DIAGNOSIS — F1014 Alcohol abuse with alcohol-induced mood disorder: Secondary | ICD-10-CM | POA: Diagnosis present

## 2017-12-17 DIAGNOSIS — S022XXA Fracture of nasal bones, initial encounter for closed fracture: Secondary | ICD-10-CM | POA: Insufficient documentation

## 2017-12-17 DIAGNOSIS — R45851 Suicidal ideations: Secondary | ICD-10-CM | POA: Insufficient documentation

## 2017-12-17 DIAGNOSIS — Y998 Other external cause status: Secondary | ICD-10-CM | POA: Insufficient documentation

## 2017-12-17 DIAGNOSIS — F10929 Alcohol use, unspecified with intoxication, unspecified: Secondary | ICD-10-CM | POA: Insufficient documentation

## 2017-12-17 DIAGNOSIS — Y92019 Unspecified place in single-family (private) house as the place of occurrence of the external cause: Secondary | ICD-10-CM | POA: Insufficient documentation

## 2017-12-17 DIAGNOSIS — R45850 Homicidal ideations: Secondary | ICD-10-CM | POA: Insufficient documentation

## 2017-12-17 DIAGNOSIS — F17210 Nicotine dependence, cigarettes, uncomplicated: Secondary | ICD-10-CM | POA: Insufficient documentation

## 2017-12-17 DIAGNOSIS — F10920 Alcohol use, unspecified with intoxication, uncomplicated: Secondary | ICD-10-CM

## 2017-12-17 LAB — CBC WITH DIFFERENTIAL/PLATELET
Basophils Absolute: 0 10*3/uL (ref 0.0–0.1)
Basophils Relative: 1 %
Eosinophils Absolute: 0 10*3/uL (ref 0.0–0.7)
Eosinophils Relative: 0 %
HEMATOCRIT: 47 % (ref 39.0–52.0)
Hemoglobin: 16.8 g/dL (ref 13.0–17.0)
LYMPHS ABS: 3.2 10*3/uL (ref 0.7–4.0)
LYMPHS PCT: 40 %
MCH: 33.7 pg (ref 26.0–34.0)
MCHC: 35.7 g/dL (ref 30.0–36.0)
MCV: 94.4 fL (ref 78.0–100.0)
MONO ABS: 0.7 10*3/uL (ref 0.1–1.0)
MONOS PCT: 9 %
NEUTROS ABS: 4.2 10*3/uL (ref 1.7–7.7)
Neutrophils Relative %: 50 %
Platelets: 291 10*3/uL (ref 150–400)
RBC: 4.98 MIL/uL (ref 4.22–5.81)
RDW: 12.9 % (ref 11.5–15.5)
WBC: 8.2 10*3/uL (ref 4.0–10.5)

## 2017-12-17 LAB — COMPREHENSIVE METABOLIC PANEL
ALK PHOS: 56 U/L (ref 38–126)
ALT: 21 U/L (ref 17–63)
ANION GAP: 10 (ref 5–15)
AST: 35 U/L (ref 15–41)
Albumin: 5.2 g/dL — ABNORMAL HIGH (ref 3.5–5.0)
BILIRUBIN TOTAL: 0.4 mg/dL (ref 0.3–1.2)
BUN: 8 mg/dL (ref 6–20)
CALCIUM: 9.4 mg/dL (ref 8.9–10.3)
CO2: 28 mmol/L (ref 22–32)
Chloride: 105 mmol/L (ref 101–111)
Creatinine, Ser: 1.03 mg/dL (ref 0.61–1.24)
GFR calc Af Amer: >60 mL/min (ref >60)
Glucose, Bld: 94 mg/dL (ref 65–99)
POTASSIUM: 3.6 mmol/L (ref 3.5–5.1)
Sodium: 143 mmol/L (ref 135–145)
TOTAL PROTEIN: 9.1 g/dL — AB (ref 6.5–8.1)

## 2017-12-17 LAB — RAPID URINE DRUG SCREEN, HOSP PERFORMED
AMPHETAMINES: NOT DETECTED
Barbiturates: NOT DETECTED
Benzodiazepines: NOT DETECTED
Cocaine: NOT DETECTED
OPIATES: NOT DETECTED
Tetrahydrocannabinol: POSITIVE — AB

## 2017-12-17 LAB — ETHANOL: Alcohol, Ethyl (B): 267 mg/dL — ABNORMAL HIGH (ref <10)

## 2017-12-17 MED ORDER — NICOTINE 21 MG/24HR TD PT24: 21.0000 mg | MEDICATED_PATCH | Freq: Every day | TRANSDERMAL | Status: DC

## 2017-12-17 MED ORDER — STERILE WATER FOR INJECTION IJ SOLN
INTRAMUSCULAR | Status: AC
  Administered 2017-12-17: 1.2 mL
  Filled 2017-12-17: qty 10

## 2017-12-17 MED ORDER — ACETAMINOPHEN 325 MG PO TABS: 650.0000 mg | ORAL_TABLET | ORAL | Status: DC | PRN

## 2017-12-17 MED ORDER — ZIPRASIDONE MESYLATE 20 MG IM SOLR: 10.0000 mg | Freq: Once | INTRAMUSCULAR | Status: DC

## 2017-12-17 MED ORDER — ALUM & MAG HYDROXIDE-SIMETH 200-200-20 MG/5ML PO SUSP: 30.0000 mL | Freq: Four times a day (QID) | ORAL | Status: DC | PRN

## 2017-12-17 MED ORDER — STERILE WATER FOR INJECTION IJ SOLN
INTRAMUSCULAR | Status: AC
  Filled 2017-12-17: qty 10

## 2017-12-17 MED ORDER — ZIPRASIDONE MESYLATE 20 MG IM SOLR
10.0000 mg | Freq: Once | INTRAMUSCULAR | Status: AC
  Administered 2017-12-17: 10 mg via INTRAMUSCULAR
  Filled 2017-12-17: qty 20

## 2017-12-17 MED ORDER — TETANUS-DIPHTH-ACELL PERTUSSIS 5-2.5-18.5 LF-MCG/0.5 IM SUSP: 0.5000 mL | Freq: Once | INTRAMUSCULAR | Status: DC

## 2017-12-17 MED ORDER — ZIPRASIDONE MESYLATE 20 MG IM SOLR
INTRAMUSCULAR | Status: AC
  Filled 2017-12-17: qty 20

## 2017-12-17 MED ORDER — ONDANSETRON HCL 4 MG PO TABS: 4.0000 mg | ORAL_TABLET | Freq: Three times a day (TID) | ORAL | Status: DC | PRN

## 2017-12-17 NOTE — [facility] Assessment (Signed)
[facility]  Per Juanetta BeetsJacqueline Norman, DO, this pt does not require psychiatric hospitalization at this time.  Pt presents under IVC initiated by EDP Dione Boozeavid Glick, MD.  Pt does not require any follow-up referrals.  Pt's nurse, Aram BeechamCynthia, has been notified.  Doylene Canninghomas Yakov Bergen, MA Triage Specialist [phone]

## 2017-12-17 NOTE — ED Notes (Signed)
Pt talking to no one in the room, making threats, being loud.

## 2017-12-17 NOTE — ED Notes (Signed)
Bed: WA31 Expected date:  Expected time:  Means of arrival:  Comments: 

## 2017-12-17 NOTE — Progress Notes (Signed)
Contacted pt's nurse, Consuella LoseElaine, to inform her that Donell SievertSpencer Simon, PA, determined pt will be evaluated overnight for safety and security and will then be re-evaluated in the morning to determine if pt meets inpatient criteria. Nurse Consuella LoseElaine informed clinician that pt was now IVC and that they were able to determine that pt had been released from jail in November 2018 after serving 3 months due to larceny.

## 2017-12-17 NOTE — ED Notes (Signed)
Pt requesting water, then pouring it out stating "I ain't going to drink that.  You put pills in it."
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
## 2017-12-17 NOTE — ED Notes (Signed)
Patient transported to CT 

## 2017-12-17 NOTE — ED Notes (Signed)
Patient lying in bed with face covered by cloth.  Would not remove cloth during assessment by the psychiatric team.  Patient is very difficult to understand.  He would not interact with psyche team and would not answer when asked what we could do to help him.
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
## 2023-04-18 ENCOUNTER — Emergency Department (HOSPITAL_COMMUNITY): Payer: BLUE CROSS/BLUE SHIELD

## 2023-04-18 ENCOUNTER — Encounter (HOSPITAL_COMMUNITY): Payer: Self-pay

## 2023-04-18 ENCOUNTER — Emergency Department (HOSPITAL_COMMUNITY)
Admission: EM | Admit: 2023-04-18 | Discharge: 2023-04-18 | Payer: BLUE CROSS/BLUE SHIELD | Attending: Emergency Medicine | Admitting: Emergency Medicine

## 2023-04-18 ENCOUNTER — Other Ambulatory Visit: Payer: Self-pay

## 2023-04-18 DIAGNOSIS — Y92410 Unspecified street and highway as the place of occurrence of the external cause: Secondary | ICD-10-CM | POA: Insufficient documentation

## 2023-04-18 DIAGNOSIS — R1110 Vomiting, unspecified: Secondary | ICD-10-CM | POA: Insufficient documentation

## 2023-04-18 DIAGNOSIS — M542 Cervicalgia: Secondary | ICD-10-CM | POA: Insufficient documentation
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
## 2023-04-18 NOTE — Discharge Instructions (Signed)
Your CT imaging was negative for bleeding inside her skull or fractures of the neck.  Please follow-up with your family doctor in the office.  You will hurt worse tomorrow that is normal.  Max dosing for over-the-counter medications listed below.  Take 4 over the counter ibuprofen tablets 3 times a day or 2 over-the-counter naproxen tablets twice a day for pain. Also take tylenol 1000mg (2 extra strength) four times a day.
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
